# Patient Record
Sex: Male | Born: 1980 | Race: White | Hispanic: No | Smoking: Current every day smoker
Health system: Southern US, Community
[De-identification: ages and names within clinical notes are randomized; demographics above are authoritative.]

## PROBLEM LIST (undated history)

## (undated) ENCOUNTER — Emergency Department (HOSPITAL_BASED_OUTPATIENT_CLINIC_OR_DEPARTMENT_OTHER): Admission: EM | Payer: PRIVATE HEALTH INSURANCE | Source: Home / Self Care

## (undated) ENCOUNTER — Emergency Department (HOSPITAL_COMMUNITY): Admission: EM | Payer: PRIVATE HEALTH INSURANCE | Source: Home / Self Care

## (undated) DIAGNOSIS — F1910 Other psychoactive substance abuse, uncomplicated: Secondary | ICD-10-CM

## (undated) DIAGNOSIS — C9590 Leukemia, unspecified not having achieved remission: Secondary | ICD-10-CM

## (undated) HISTORY — PX: FACIAL FRACTURE SURGERY: SHX1570

## (undated) HISTORY — PX: FACIAL COSMETIC SURGERY: SHX629

---

## 2005-11-10 ENCOUNTER — Emergency Department (HOSPITAL_COMMUNITY): Admission: EM | Admit: 2005-11-10 | Discharge: 2005-11-10 | Payer: Self-pay | Admitting: Emergency Medicine

## 2006-02-02 ENCOUNTER — Emergency Department (HOSPITAL_COMMUNITY): Admission: EM | Admit: 2006-02-02 | Discharge: 2006-02-02 | Payer: Self-pay | Admitting: Emergency Medicine

## 2006-02-19 ENCOUNTER — Emergency Department (HOSPITAL_COMMUNITY): Admission: EM | Admit: 2006-02-19 | Discharge: 2006-02-19 | Payer: Self-pay | Admitting: Emergency Medicine

## 2006-08-06 ENCOUNTER — Emergency Department (HOSPITAL_COMMUNITY): Admission: EM | Admit: 2006-08-06 | Discharge: 2006-08-06 | Payer: Self-pay | Admitting: Emergency Medicine

## 2008-09-04 ENCOUNTER — Ambulatory Visit (HOSPITAL_COMMUNITY): Admission: EM | Admit: 2008-09-04 | Discharge: 2008-09-05 | Payer: Self-pay | Admitting: Emergency Medicine

## 2008-09-04 ENCOUNTER — Ambulatory Visit: Payer: Self-pay | Admitting: Diagnostic Radiology

## 2008-09-04 ENCOUNTER — Encounter: Payer: Self-pay | Admitting: Emergency Medicine

## 2009-01-14 ENCOUNTER — Emergency Department (HOSPITAL_BASED_OUTPATIENT_CLINIC_OR_DEPARTMENT_OTHER): Admission: EM | Admit: 2009-01-14 | Discharge: 2009-01-14 | Payer: Self-pay | Admitting: Emergency Medicine

## 2010-01-03 ENCOUNTER — Emergency Department (HOSPITAL_BASED_OUTPATIENT_CLINIC_OR_DEPARTMENT_OTHER): Admission: EM | Admit: 2010-01-03 | Discharge: 2010-01-03 | Payer: Self-pay | Admitting: Emergency Medicine

## 2010-06-10 ENCOUNTER — Emergency Department (HOSPITAL_COMMUNITY): Payer: PRIVATE HEALTH INSURANCE

## 2010-06-10 ENCOUNTER — Emergency Department (HOSPITAL_COMMUNITY)
Admission: EM | Admit: 2010-06-10 | Discharge: 2010-06-10 | Disposition: A | Discharge status: Home / Self Care | Payer: PRIVATE HEALTH INSURANCE | Attending: Emergency Medicine | Admitting: Emergency Medicine

## 2010-06-10 DIAGNOSIS — R0609 Other forms of dyspnea: Secondary | ICD-10-CM | POA: Insufficient documentation

## 2010-06-10 DIAGNOSIS — J029 Acute pharyngitis, unspecified: Secondary | ICD-10-CM | POA: Insufficient documentation

## 2010-06-10 DIAGNOSIS — R0989 Other specified symptoms and signs involving the circulatory and respiratory systems: Secondary | ICD-10-CM | POA: Insufficient documentation

## 2010-06-10 DIAGNOSIS — R059 Cough, unspecified: Secondary | ICD-10-CM | POA: Insufficient documentation

## 2010-06-10 DIAGNOSIS — R062 Wheezing: Secondary | ICD-10-CM | POA: Insufficient documentation

## 2010-06-10 DIAGNOSIS — R05 Cough: Secondary | ICD-10-CM | POA: Insufficient documentation

## 2010-07-14 LAB — CBC
Hemoglobin: 15.1 g/dL (ref 13.0–17.0)
MCH: 32.1 pg (ref 26.0–34.0)
MCHC: 34.5 g/dL (ref 30.0–36.0)
MCV: 93 fL (ref 78.0–100.0)
Platelets: 257 10*3/uL (ref 150–400)
RDW: 11.4 % — ABNORMAL LOW (ref 11.5–15.5)
WBC: 10.6 10*3/uL — ABNORMAL HIGH (ref 4.0–10.5)

## 2010-07-14 LAB — COMPREHENSIVE METABOLIC PANEL
BUN: 10 mg/dL (ref 6–23)
Chloride: 101 mEq/L (ref 96–112)
Creatinine, Ser: 0.8 mg/dL (ref 0.4–1.5)
GFR calc Af Amer: >60 mL/min (ref >60)
Glucose, Bld: 88 mg/dL (ref 70–99)
Sodium: 139 mEq/L (ref 135–145)
Total Bilirubin: 1.3 mg/dL — ABNORMAL HIGH (ref 0.3–1.2)

## 2010-07-14 LAB — DIFFERENTIAL
Eosinophils Relative: 1 % (ref 0–5)
Monocytes Absolute: 0.9 10*3/uL (ref 0.1–1.0)
Monocytes Relative: 8 % (ref 3–12)
Neutrophils Relative %: 71 % (ref 43–77)

## 2010-07-14 LAB — POCT CARDIAC MARKERS
CKMB, poc: 2.2 ng/mL (ref 1.0–8.0)
Troponin i, poc: <0.05 ng/mL (ref 0.00–0.09)

## 2010-08-09 LAB — CBC
HCT: 46.1 % (ref 39.0–52.0)
MCHC: 34.3 g/dL (ref 30.0–36.0)
MCV: 92.1 fL (ref 78.0–100.0)
Platelets: 238 10*3/uL (ref 150–400)
RBC: 5.01 MIL/uL (ref 4.22–5.81)
RDW: 11.5 % (ref 11.5–15.5)
WBC: 15.5 10*3/uL — ABNORMAL HIGH (ref 4.0–10.5)

## 2010-08-09 LAB — DIFFERENTIAL
Eosinophils Absolute: 0.1 10*3/uL (ref 0.0–0.7)
Eosinophils Relative: 1 % (ref 0–5)
Monocytes Relative: 4 % (ref 3–12)
Neutro Abs: 13.3 10*3/uL — ABNORMAL HIGH (ref 1.7–7.7)

## 2010-08-09 LAB — COMPREHENSIVE METABOLIC PANEL
ALT: 15 U/L (ref 0–53)
BUN: 13 mg/dL (ref 6–23)
Calcium: 9 mg/dL (ref 8.4–10.5)
Chloride: 106 mEq/L (ref 96–112)
Creatinine, Ser: 0.8 mg/dL (ref 0.4–1.5)
GFR calc Af Amer: >60 mL/min (ref >60)
Glucose, Bld: 91 mg/dL (ref 70–99)
Total Bilirubin: 0.8 mg/dL (ref 0.3–1.2)
Total Protein: 7.5 g/dL (ref 6.0–8.3)
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
## 2010-11-14 ENCOUNTER — Encounter: Payer: Self-pay | Admitting: *Deleted

## 2010-11-14 ENCOUNTER — Emergency Department (HOSPITAL_BASED_OUTPATIENT_CLINIC_OR_DEPARTMENT_OTHER)
Admission: EM | Admit: 2010-11-14 | Discharge: 2010-11-14 | Disposition: A | Discharge status: Home / Self Care | Payer: PRIVATE HEALTH INSURANCE | Attending: Emergency Medicine | Admitting: Emergency Medicine

## 2010-11-14 DIAGNOSIS — F17200 Nicotine dependence, unspecified, uncomplicated: Secondary | ICD-10-CM | POA: Insufficient documentation

## 2010-11-14 DIAGNOSIS — Z711 Person with feared health complaint in whom no diagnosis is made: Secondary | ICD-10-CM | POA: Insufficient documentation

## 2010-11-14 DIAGNOSIS — IMO0001 Reserved for inherently not codable concepts without codable children: Secondary | ICD-10-CM

## 2010-11-14 NOTE — ED Provider Notes (Signed)
History     Chief Complaint  Patient presents with  . SEXUALLY TRANSMITTED DISEASE   HPI Comments: Pt had unprotected oral sex 3 days ago.  Pt reports he was concerned that he could have caught a disease that his wife could get from him.   Pt has not had any symptoms.  No discharge.  No lesions.    Patient is a 30 y.o. male presenting with general illness. The history is provided by the patient.  Illness  The current episode started 2 days ago. Pertinent negatives include no fever, no abdominal pain and no swollen glands.    History reviewed. No pertinent past medical history.  History reviewed. No pertinent past surgical history.  History reviewed. No pertinent family history.  History  Substance Use Topics  . Smoking status: Current Everyday Smoker  . Smokeless tobacco: Not on file  . Alcohol Use: No      Review of Systems  Constitutional: Negative for fever.  Gastrointestinal: Negative for abdominal pain.  All other systems reviewed and are negative.    Physical Exam  BP 94/69  Pulse 64  Temp(Src) 98.5 F (36.9 C) (Oral)  Resp 20  Ht 5\' 11"  (1.803 m)  Wt 205 lb (92.987 kg)  BMI 28.59 kg/m2  SpO2 98%  Physical Exam  Constitutional: He appears well-developed and well-nourished.  HENT:  Head: Normocephalic.  Neurological: He is alert.    ED Course  Procedures  I advised pt to go to Health dept for evaluation.  I advised that we could test for gc and ct.  Pt declined      Langston Masker, [place] 11/14/10 1336  [place], [place] 11/14/10 [phone]
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
## 2010-11-15 ENCOUNTER — Emergency Department (HOSPITAL_COMMUNITY)
Admission: EM | Admit: 2010-11-15 | Discharge: 2010-11-15 | Disposition: A | Discharge status: Home / Self Care | Payer: PRIVATE HEALTH INSURANCE | Attending: Emergency Medicine | Admitting: Emergency Medicine

## 2010-11-15 DIAGNOSIS — Z202 Contact with and (suspected) exposure to infections with a predominantly sexual mode of transmission: Secondary | ICD-10-CM | POA: Insufficient documentation

## 2010-11-15 LAB — URINALYSIS, ROUTINE W REFLEX MICROSCOPIC
Glucose, UA: NEGATIVE mg/dL
Hgb urine dipstick: NEGATIVE
Protein, ur: NEGATIVE mg/dL
pH: 7 (ref 5.0–8.0)

## 2010-11-15 NOTE — ED Provider Notes (Signed)
Patient's care supervised by me.  I have read and reviewed note and agree with care.  Hilario Quarry, MD 11/15/10 [phone]

## 2010-11-16 LAB — GC/CHLAMYDIA PROBE AMP, GENITAL: Chlamydia, DNA Probe: NEGATIVE

## 2011-07-04 ENCOUNTER — Encounter (HOSPITAL_BASED_OUTPATIENT_CLINIC_OR_DEPARTMENT_OTHER): Payer: Self-pay | Admitting: *Deleted

## 2011-07-04 ENCOUNTER — Emergency Department (HOSPITAL_BASED_OUTPATIENT_CLINIC_OR_DEPARTMENT_OTHER)
Admission: EM | Admit: 2011-07-04 | Discharge: 2011-07-04 | Disposition: A | Discharge status: Home / Self Care | Payer: PRIVATE HEALTH INSURANCE | Attending: Emergency Medicine | Admitting: Emergency Medicine

## 2011-07-04 DIAGNOSIS — J329 Chronic sinusitis, unspecified: Secondary | ICD-10-CM

## 2011-07-04 DIAGNOSIS — F17200 Nicotine dependence, unspecified, uncomplicated: Secondary | ICD-10-CM | POA: Insufficient documentation

## 2011-07-04 MED ORDER — AMOXICILLIN 500 MG PO CAPS: 500.0000 mg | ORAL_CAPSULE | Freq: Three times a day (TID) | ORAL | Status: AC

## 2011-07-04 NOTE — ED Notes (Signed)
States he has had sinus problems with nasal drainage, sore throat and headache on and off for the past month.

## 2011-07-04 NOTE — Discharge Instructions (Signed)
Sinusitis Sinusitis an infection of the air pockets (sinuses) in your face. This can cause puffiness (swelling). It can also cause drainage from your sinuses.   HOME CARE    Only take medicine as told by your doctor.   Drink enough fluids to keep your pee (urine) clear or pale yellow.   Apply moist heat or ice packs for pain relief.   Use salt (saline) nose sprays. The spray will wet the thick fluid in the nose. This can help the sinuses drain.  GET HELP RIGHT AWAY IF:    You have a fever.   Your baby is older than 3 months with a rectal temperature of 102 F (38.9 C) or higher.   Your baby is 3 months old or younger with a rectal temperature of 100.4 F (38 C) or higher.   The pain gets worse.   You get a very bad headache.   You keep throwing up (vomiting).   Your face gets puffy.  MAKE SURE YOU:    Understand these instructions.   Will watch your condition.   Will get help right away if you are not doing well or get worse.  Document Released: 10/04/2007 Document Revised: 04/06/2011 Document Reviewed: 10/04/2007 Exit[employer] Patient Information 2012 [employer]. 

## 2011-07-04 NOTE — ED Provider Notes (Signed)
History     CSN: [id number]  Arrival date & time 07/04/11  1949   First MD Initiated Contact with Patient 07/04/11 2000      Chief Complaint  Patient presents with  . Nasal Congestion    (Consider location/radiation/quality/duration/timing/severity/associated sxs/prior treatment) Patient is a 31 y.o. male presenting with sinusitis. The history is provided by the patient. No language interpreter was used.  Sinusitis  This is a new problem. The current episode started more than 1 week ago. The problem has not changed since onset.There has been no fever. The pain is moderate. The pain has been constant since onset. Associated symptoms include congestion and sinus pressure. Pertinent negatives include no cough and no shortness of breath. He has tried acetaminophen and other medications for the symptoms. The treatment provided no relief.    History reviewed. No pertinent past medical history.  History reviewed. No pertinent past surgical history.  History reviewed. No pertinent family history.  History  Substance Use Topics  . Smoking status: Current Everyday Smoker  . Smokeless tobacco: Not on file  . Alcohol Use: No      Review of Systems  HENT: Positive for congestion and sinus pressure.   Respiratory: Negative for cough and shortness of breath.   All other systems reviewed and are negative.    Allergies  Review of patient's allergies indicates no known allergies.  Home Medications   Current Outpatient Rx  Name Route Sig Dispense Refill  . ACETAMINOPHEN 500 MG PO TABS Oral Take 1,000 mg by mouth once as needed. For pain    . TYLENOL SINUS CONGESTION/PAIN PO Oral Take 2 tablets by mouth once as needed. For sinus pain    . PSEUDOEPHEDRINE-IBUPROFEN 30-200 MG PO TABS Oral Take 2 tablets by mouth once as needed. For sinus pain    . AMOXICILLIN 500 MG PO CAPS Oral Take 1 capsule (500 mg total) by mouth 3 (three) times daily. 30 capsule 0    BP 126/99  Pulse 76   Temp(Src) 97.1 F (36.2 C) (Oral)  Resp 18  Ht 5\' 11"  (1.803 m)  Wt 190 lb (86.183 kg)  BMI 26.50 kg/m2  SpO2 100%  Physical Exam  Nursing note and vitals reviewed. Constitutional: He appears well-developed and well-nourished.  HENT:  Head: Normocephalic and atraumatic.  Right Ear: External ear normal.  Left Ear: External ear normal.  Nose: Mucosal edema present. Right sinus exhibits frontal sinus tenderness. Left sinus exhibits frontal sinus tenderness.  Cardiovascular: Normal rate and regular rhythm.   Pulmonary/Chest: Effort normal and breath sounds normal.    ED Course  Procedures (including critical care time)  Labs Reviewed - No data to display No results found.   1. Sinusitis       MDM  Will treat for sinusitis         Teressa Lower, NP 07/04/11 2028

## 2011-07-04 NOTE — ED Notes (Signed)
Pt c/o simus pressure x 1 month

## 2011-07-05 NOTE — ED Provider Notes (Signed)
Medical screening examination/treatment/procedure(s) were performed by non-physician practitioner and as supervising physician I was immediately available for consultation/collaboration.   Brennan Karam, MD 07/05/11 0823 

## 2012-04-18 ENCOUNTER — Emergency Department (HOSPITAL_BASED_OUTPATIENT_CLINIC_OR_DEPARTMENT_OTHER)
Admission: EM | Admit: 2012-04-18 | Discharge: 2012-04-18 | Disposition: A | Discharge status: Home / Self Care | Payer: PRIVATE HEALTH INSURANCE | Attending: Emergency Medicine | Admitting: Emergency Medicine

## 2012-04-18 ENCOUNTER — Encounter (HOSPITAL_BASED_OUTPATIENT_CLINIC_OR_DEPARTMENT_OTHER): Payer: Self-pay | Admitting: Family Medicine

## 2012-04-18 DIAGNOSIS — S0993XA Unspecified injury of face, initial encounter: Secondary | ICD-10-CM | POA: Insufficient documentation

## 2012-04-18 DIAGNOSIS — Z791 Long term (current) use of non-steroidal anti-inflammatories (NSAID): Secondary | ICD-10-CM | POA: Insufficient documentation

## 2012-04-18 DIAGNOSIS — S4980XA Other specified injuries of shoulder and upper arm, unspecified arm, initial encounter: Secondary | ICD-10-CM | POA: Insufficient documentation

## 2012-04-18 DIAGNOSIS — Y9389 Activity, other specified: Secondary | ICD-10-CM | POA: Insufficient documentation

## 2012-04-18 DIAGNOSIS — S46909A Unspecified injury of unspecified muscle, fascia and tendon at shoulder and upper arm level, unspecified arm, initial encounter: Secondary | ICD-10-CM | POA: Insufficient documentation

## 2012-04-18 DIAGNOSIS — Z79899 Other long term (current) drug therapy: Secondary | ICD-10-CM | POA: Insufficient documentation

## 2012-04-18 DIAGNOSIS — F17200 Nicotine dependence, unspecified, uncomplicated: Secondary | ICD-10-CM | POA: Insufficient documentation

## 2012-04-18 DIAGNOSIS — IMO0001 Reserved for inherently not codable concepts without codable children: Secondary | ICD-10-CM | POA: Insufficient documentation

## 2012-04-18 DIAGNOSIS — S4990XA Unspecified injury of shoulder and upper arm, unspecified arm, initial encounter: Secondary | ICD-10-CM

## 2012-04-18 DIAGNOSIS — S199XXA Unspecified injury of neck, initial encounter: Secondary | ICD-10-CM | POA: Insufficient documentation

## 2012-04-18 DIAGNOSIS — Y929 Unspecified place or not applicable: Secondary | ICD-10-CM | POA: Insufficient documentation

## 2012-04-18 DIAGNOSIS — R296 Repeated falls: Secondary | ICD-10-CM | POA: Insufficient documentation

## 2012-04-18 DIAGNOSIS — IMO0002 Reserved for concepts with insufficient information to code with codable children: Secondary | ICD-10-CM | POA: Insufficient documentation

## 2012-04-18 MED ORDER — HYDROCODONE-ACETAMINOPHEN 5-325 MG PO TABS: 1.0000 | ORAL_TABLET | Freq: Four times a day (QID) | ORAL | Status: DC | PRN

## 2012-04-18 MED ORDER — METHOCARBAMOL 500 MG PO TABS: 500.0000 mg | ORAL_TABLET | Freq: Two times a day (BID) | ORAL | Status: DC

## 2012-04-18 MED ORDER — IBUPROFEN 600 MG PO TABS: 600.0000 mg | ORAL_TABLET | Freq: Four times a day (QID) | ORAL | Status: DC | PRN

## 2012-04-18 NOTE — ED Provider Notes (Signed)
History     CSN: [id number]  Arrival date & time 04/18/12  1038   First MD Initiated Contact with Patient 04/18/12 1200      Chief Complaint  Patient presents with  . Back Pain    (Consider location/radiation/quality/duration/timing/severity/associated sxs/prior treatment) HPI Comments: PT comes in with cc of back injury. About 2 days ago, patient was putting up Christmas lights on the roof, slid, and to prevent a fall from the roof hung on to a ledge. Pt has been having some back pain since then - located on the left subscapular side, neck and shoulder. Although he has constant mild pain, the pain is really worse with any movement for the shoulder or lifting.  The history is provided by the patient.    History reviewed. No pertinent past medical history.  History reviewed. No pertinent past surgical history.  No family history on file.  History  Substance Use Topics  . Smoking status: Current Every Day Smoker  . Smokeless tobacco: Not on file  . Alcohol Use: No      Review of Systems  Constitutional: Positive for activity change.  Respiratory: Negative for chest tightness.   Cardiovascular: Negative for chest pain.  Gastrointestinal: Negative for abdominal pain.  Musculoskeletal: Positive for myalgias.    Allergies  Review of patient's allergies indicates no known allergies.  Home Medications   Current Outpatient Rx  Name  Route  Sig  Dispense  Refill  . ACETAMINOPHEN 500 MG PO TABS   Oral   Take 1,000 mg by mouth once as needed. For pain         . HYDROCODONE-ACETAMINOPHEN 5-325 MG PO TABS   Oral   Take 1 tablet by mouth every 6 (six) hours as needed for pain.   15 tablet   0   . IBUPROFEN 600 MG PO TABS   Oral   Take 1 tablet (600 mg total) by mouth every 6 (six) hours as needed for pain.   30 tablet   0   . METHOCARBAMOL 500 MG PO TABS   Oral   Take 1 tablet (500 mg total) by mouth 2 (two) times daily.   20 tablet   0   . TYLENOL SINUS  CONGESTION/PAIN PO   Oral   Take 2 tablets by mouth once as needed. For sinus pain         . PSEUDOEPHEDRINE-IBUPROFEN 30-200 MG PO TABS   Oral   Take 2 tablets by mouth once as needed. For sinus pain           BP 114/72  Pulse 80  Temp 97.6 F (36.4 C) (Oral)  Resp 16  Ht 5\' 11"  (1.803 m)  Wt 190 lb (86.183 kg)  BMI 26.50 kg/m2  SpO2 100%  Physical Exam  Nursing note and vitals reviewed. Constitutional: He is oriented to person, place, and time. He appears well-developed.  HENT:  Head: Normocephalic and atraumatic.  Eyes: Conjunctivae normal and EOM are normal. Pupils are equal, round, and reactive to light.  Neck: Normal range of motion. Neck supple.  Cardiovascular: Normal rate and regular rhythm.   Pulmonary/Chest: Effort normal and breath sounds normal.  Abdominal: Soft. Bowel sounds are normal. He exhibits no distension. There is no tenderness. There is no rebound and no guarding.  Musculoskeletal: He exhibits no edema and no tenderness.       No deformity, no muscular atrophy or lumping seen. Pt able to abduct, adduct, forward flex. He has tenderness with  all movement. No winged scapula. With palpation, the tenderness present over the subscapular region.  Neurological: He is alert and oriented to person, place, and time.  Skin: Skin is warm.    ED Course  Procedures (including critical care time)  Labs Reviewed - No data to display No results found.   1. Injury of scapular region   2. Sprain and strain of back       MDM  Pt comes in with some muscular injury to the back. While falling, he held on to a ledge and ended up hanging on the ledge for a couple of minutes to prevent a fall. Though no evidence of muscular tear is appreciated - it is possible that he might have some partial tear.  Plan is for him to use RICE tx, analgesics, and if not better within a period of few days - see orthopedic doctor as he will need PT and possible US/MRI.  Derwood Kaplan, MD 04/18/12 1310

## 2012-04-18 NOTE — ED Notes (Addendum)
Pt c/o left lateral neck pain, left shoulder and left upper back pain after hanging christmas lights on house Tuesday. Pt sts "I think I pulled a muscle" pt sts he is having a hard time performing his job responsibilities due to pain. Pt drove self to ED.

## 2013-04-18 ENCOUNTER — Emergency Department (HOSPITAL_BASED_OUTPATIENT_CLINIC_OR_DEPARTMENT_OTHER)
Admission: EM | Admit: 2013-04-18 | Discharge: 2013-04-18 | Disposition: A | Discharge status: Home / Self Care | Payer: PRIVATE HEALTH INSURANCE | Attending: Emergency Medicine | Admitting: Emergency Medicine

## 2013-04-18 ENCOUNTER — Encounter (HOSPITAL_BASED_OUTPATIENT_CLINIC_OR_DEPARTMENT_OTHER): Payer: Self-pay | Admitting: Emergency Medicine

## 2013-04-18 ENCOUNTER — Emergency Department (HOSPITAL_BASED_OUTPATIENT_CLINIC_OR_DEPARTMENT_OTHER): Payer: PRIVATE HEALTH INSURANCE

## 2013-04-18 DIAGNOSIS — K0381 Cracked tooth: Secondary | ICD-10-CM | POA: Insufficient documentation

## 2013-04-18 DIAGNOSIS — J069 Acute upper respiratory infection, unspecified: Secondary | ICD-10-CM | POA: Insufficient documentation

## 2013-04-18 DIAGNOSIS — K089 Disorder of teeth and supporting structures, unspecified: Secondary | ICD-10-CM | POA: Insufficient documentation

## 2013-04-18 DIAGNOSIS — Z79899 Other long term (current) drug therapy: Secondary | ICD-10-CM | POA: Insufficient documentation

## 2013-04-18 DIAGNOSIS — F17200 Nicotine dependence, unspecified, uncomplicated: Secondary | ICD-10-CM | POA: Insufficient documentation

## 2013-04-18 DIAGNOSIS — K0889 Other specified disorders of teeth and supporting structures: Secondary | ICD-10-CM

## 2013-04-18 MED ORDER — AMOXICILLIN 500 MG PO CAPS: 500.0000 mg | ORAL_CAPSULE | Freq: Three times a day (TID) | ORAL | Status: DC

## 2013-04-18 MED ORDER — HYDROCODONE-ACETAMINOPHEN 5-325 MG PO TABS: 2.0000 | ORAL_TABLET | ORAL | Status: DC | PRN

## 2013-04-18 NOTE — ED Provider Notes (Signed)
Medical screening examination/treatment/procedure(s) were performed by non-physician practitioner and as supervising physician I was immediately available for consultation/collaboration.  EKG Interpretation   None        Doug Sou, MD 04/18/13 [phone]

## 2013-04-18 NOTE — ED Notes (Signed)
Dental pain. States he has also has a drug problem and would like a referral to detox.

## 2013-04-18 NOTE — ED Provider Notes (Signed)
CSN: [id number]     Arrival date & time 04/18/13  1818 History   First MD Initiated Contact with Patient 04/18/13 1840     Chief Complaint  Patient presents with  . Dental Pain   (Consider location/radiation/quality/duration/timing/severity/associated sxs/prior Treatment) Patient is a 32 y.o. male presenting with tooth pain. The history is provided by the patient.  Dental Pain Quality:  Shooting Severity:  Moderate Onset quality:  Gradual Duration:  3 days Timing:  Constant Progression:  Worsening Chronicity:  New Relieved by:  Nothing Worsened by:  Nothing tried Ineffective treatments:  None tried Associated symptoms: congestion     History reviewed. No pertinent past medical history. History reviewed. No pertinent past surgical history. No family history on file. History  Substance Use Topics  . Smoking status: Current Every Day Smoker  . Smokeless tobacco: Not on file  . Alcohol Use: No    Review of Systems  HENT: Positive for congestion.   All other systems reviewed and are negative.    Allergies  Review of patient's allergies indicates no known allergies.  Home Medications   Current Outpatient Rx  Name  Route  Sig  Dispense  Refill  . acetaminophen (TYLENOL) 500 MG tablet   Oral   Take 1,000 mg by mouth once as needed. For pain         . HYDROcodone-acetaminophen (NORCO/VICODIN) 5-325 MG per tablet   Oral   Take 1 tablet by mouth every 6 (six) hours as needed for pain.   15 tablet   0   . ibuprofen (ADVIL,MOTRIN) 600 MG tablet   Oral   Take 1 tablet (600 mg total) by mouth every 6 (six) hours as needed for pain.   30 tablet   0   . methocarbamol (ROBAXIN) 500 MG tablet   Oral   Take 1 tablet (500 mg total) by mouth 2 (two) times daily.   20 tablet   0   . Phenylephrine-Acetaminophen (TYLENOL SINUS CONGESTION/PAIN PO)   Oral   Take 2 tablets by mouth once as needed. For sinus pain         . Pseudoephedrine-Ibuprofen 30-200 MG TABS  Oral   Take 2 tablets by mouth once as needed. For sinus pain          BP 128/77  Pulse 110  Temp(Src) 98.5 F (36.9 C) (Oral)  Resp 20  Ht 5\' 11"  (1.803 m)  Wt 172 lb (78.019 kg)  BMI 24.00 kg/m2  SpO2 100% Physical Exam  Nursing note and vitals reviewed. Constitutional: He is oriented to person, place, and time. He appears well-developed and well-nourished.  HENT:  Head: Normocephalic.  Multiple broken teeth  Eyes: EOM are normal. Pupils are equal, round, and reactive to light.  Neck: Normal range of motion.  Cardiovascular: Normal rate.   Pulmonary/Chest: Effort normal.  Abdominal: He exhibits no distension.  Musculoskeletal: Normal range of motion.  Neurological: He is alert and oriented to person, place, and time.  Psychiatric: He has a normal mood and affect.    ED Course  Procedures (including critical care time) Labs Review Labs Reviewed - No data to display Imaging Review Dg Chest 2 View  04/18/2013   CLINICAL DATA:  Left arm pain.  EXAM: CHEST  2 VIEW  COMPARISON:  06/10/2010  FINDINGS: The heart size and mediastinal contours are within normal limits. Both lungs are clear. The visualized skeletal structures are unremarkable.  IMPRESSION: No active cardiopulmonary disease.   Electronically Signed  By: Charlett Nose M.D.   On: 04/18/2013 19:17    EKG Interpretation   None       MDM   1. Toothache   2. URI (upper respiratory infection)      Behavioral health number given  Pt also given number for treatment centers   Elson Areas, [place] 04/18/13 1946

## 2013-04-27 ENCOUNTER — Encounter (HOSPITAL_COMMUNITY): Payer: Self-pay | Admitting: Emergency Medicine

## 2013-04-27 ENCOUNTER — Emergency Department (HOSPITAL_COMMUNITY)
Admission: EM | Admit: 2013-04-27 | Discharge: 2013-04-27 | Disposition: A | Discharge status: Home / Self Care | Payer: PRIVATE HEALTH INSURANCE | Attending: Emergency Medicine | Admitting: Emergency Medicine

## 2013-04-27 DIAGNOSIS — K0889 Other specified disorders of teeth and supporting structures: Secondary | ICD-10-CM

## 2013-04-27 DIAGNOSIS — K089 Disorder of teeth and supporting structures, unspecified: Secondary | ICD-10-CM | POA: Insufficient documentation

## 2013-04-27 DIAGNOSIS — J9801 Acute bronchospasm: Secondary | ICD-10-CM

## 2013-04-27 DIAGNOSIS — K029 Dental caries, unspecified: Secondary | ICD-10-CM | POA: Insufficient documentation

## 2013-04-27 DIAGNOSIS — R059 Cough, unspecified: Secondary | ICD-10-CM | POA: Insufficient documentation

## 2013-04-27 DIAGNOSIS — F1910 Other psychoactive substance abuse, uncomplicated: Secondary | ICD-10-CM | POA: Insufficient documentation

## 2013-04-27 DIAGNOSIS — R062 Wheezing: Secondary | ICD-10-CM | POA: Insufficient documentation

## 2013-04-27 DIAGNOSIS — Z79899 Other long term (current) drug therapy: Secondary | ICD-10-CM | POA: Insufficient documentation

## 2013-04-27 DIAGNOSIS — F17200 Nicotine dependence, unspecified, uncomplicated: Secondary | ICD-10-CM | POA: Insufficient documentation

## 2013-04-27 DIAGNOSIS — R05 Cough: Secondary | ICD-10-CM | POA: Insufficient documentation

## 2013-04-27 MED ORDER — PREDNISONE 20 MG PO TABS: ORAL_TABLET | ORAL | Status: DC

## 2013-04-27 MED ORDER — CLINDAMYCIN HCL 300 MG PO CAPS: 300.0000 mg | ORAL_CAPSULE | Freq: Four times a day (QID) | ORAL | Status: DC

## 2013-04-27 MED ORDER — ALBUTEROL SULFATE HFA 108 (90 BASE) MCG/ACT IN AERS
2.0000 | INHALATION_SPRAY | Freq: Once | RESPIRATORY_TRACT | Status: AC
  Administered 2013-04-27: 2 via RESPIRATORY_TRACT
  Filled 2013-04-27: qty 6.7

## 2013-04-27 MED ORDER — PREDNISONE 20 MG PO TABS
60.0000 mg | ORAL_TABLET | Freq: Once | ORAL | Status: AC
  Administered 2013-04-27: 60 mg via ORAL
  Filled 2013-04-27: qty 3

## 2013-04-27 MED ORDER — ALBUTEROL SULFATE HFA 108 (90 BASE) MCG/ACT IN AERS: 2.0000 | INHALATION_SPRAY | RESPIRATORY_TRACT | Status: DC | PRN

## 2013-04-27 NOTE — ED Notes (Signed)
Per pt states broken lower molar, having pain

## 2013-04-27 NOTE — ED Provider Notes (Signed)
CSN: [id number]     Arrival date & time 04/27/13  1719 History   First MD Initiated Contact with Patient 04/27/13 2050     Chief Complaint  Patient presents with  . Dental Pain   (Consider location/radiation/quality/duration/timing/severity/associated sxs/prior Treatment) HPI Chronic dental pain teeth numbers 18 and 30 worse the last few days, history of substance abuse states has been clean for several months but still wants to look into referrals for outpatient rehabilitation programs, not suicidal or homicidal or hallucinating, has been coughing with some mild wheezing without shortness of breath for several days no history of asthma no fever no confusion no chest pain no abdominal pain no vomiting no diarrhea no rash no treatment prior to arrival other than amoxicillin within the last couple weeks for dental pain with no improvement. History reviewed. No pertinent past medical history. History reviewed. No pertinent past surgical history. No family history on file. History  Substance Use Topics  . Smoking status: Current Every Day Smoker  . Smokeless tobacco: Not on file  . Alcohol Use: No    Review of Systems 10 Systems reviewed and are negative for acute change except as noted in the HPI. Allergies  Review of patient's allergies indicates no known allergies.  Home Medications   Current Outpatient Rx  Name  Route  Sig  Dispense  Refill  . acetaminophen (TYLENOL) 500 MG tablet   Oral   Take 1,000 mg by mouth once as needed. For pain         . benzocaine (ORAJEL) 10 % mucosal gel   Mouth/Throat   Use as directed 1 application in the mouth or throat as needed for mouth pain.         Marland Kitchen albuterol (PROVENTIL HFA;VENTOLIN HFA) 108 (90 BASE) MCG/ACT inhaler   Inhalation   Inhale 2 puffs into the lungs every 2 (two) hours as needed for wheezing or shortness of breath (cough).   1 Inhaler   0   . clindamycin (CLEOCIN) 300 MG capsule   Oral   Take 1 capsule (300 mg total)  by mouth 4 (four) times daily. X 7 days   28 capsule   0   . predniSONE (DELTASONE) 20 MG tablet      3 tabs po day one, then 2 po daily x 4 days   11 tablet   0    BP 128/75  Pulse 79  Temp(Src) 97.6 F (36.4 C) (Oral)  Resp 18  SpO2 99% Physical Exam  Nursing note and vitals reviewed. Constitutional:  Awake, alert, nontoxic appearance.  HENT:  Head: Atraumatic.  Mouth/Throat: Oropharynx is clear and moist.  Dental caries teeth numbers 18 and 30 with localized percussion tenderness with localized gingival tenderness without gingival swelling or purulent drainage no trismus no drooling no submandibular swelling  Eyes: Right eye exhibits no discharge. Left eye exhibits no discharge.  Neck: Neck supple.  Cardiovascular: Normal rate and regular rhythm.   No murmur heard. Pulmonary/Chest: Effort normal. No respiratory distress. He has wheezes. He has no rales. He exhibits no tenderness.  Scattered expiratory wheezes with pulse oximetry normal room air 97% no retractions no such or muscle usage speech is normal  Abdominal: Soft. There is no tenderness. There is no rebound.  Musculoskeletal: He exhibits no tenderness.  Baseline ROM, no obvious new focal weakness.  Lymphadenopathy:    He has no cervical adenopathy.  Neurological:  Mental status and motor strength appears baseline for patient and situation.  Skin: No  rash noted.  Psychiatric: He has a normal mood and affect.    ED Course  Procedures (including critical care time) Patient informed of clinical course, understand medical decision-making process, and agree with plan. Labs Review Labs Reviewed - No data to display Imaging Review No results found.  EKG Interpretation   None       MDM   1. Pain, dental   2. Bronchospasm   3. Substance abuse    I doubt any other EMC precluding discharge at this time including, but not necessarily limited to the following:deep space neck infection.    Hurman Horn,  MD 04/28/13 2020

## 2013-07-11 ENCOUNTER — Emergency Department (HOSPITAL_BASED_OUTPATIENT_CLINIC_OR_DEPARTMENT_OTHER)
Admission: EM | Admit: 2013-07-11 | Discharge: 2013-07-11 | Disposition: A | Discharge status: Home / Self Care | Payer: PRIVATE HEALTH INSURANCE | Attending: Emergency Medicine | Admitting: Emergency Medicine

## 2013-07-11 ENCOUNTER — Encounter (HOSPITAL_BASED_OUTPATIENT_CLINIC_OR_DEPARTMENT_OTHER): Payer: Self-pay | Admitting: Emergency Medicine

## 2013-07-11 DIAGNOSIS — J329 Chronic sinusitis, unspecified: Secondary | ICD-10-CM | POA: Insufficient documentation

## 2013-07-11 DIAGNOSIS — Z792 Long term (current) use of antibiotics: Secondary | ICD-10-CM | POA: Insufficient documentation

## 2013-07-11 DIAGNOSIS — R040 Epistaxis: Secondary | ICD-10-CM | POA: Insufficient documentation

## 2013-07-11 DIAGNOSIS — IMO0002 Reserved for concepts with insufficient information to code with codable children: Secondary | ICD-10-CM | POA: Insufficient documentation

## 2013-07-11 DIAGNOSIS — F17200 Nicotine dependence, unspecified, uncomplicated: Secondary | ICD-10-CM | POA: Insufficient documentation

## 2013-07-11 HISTORY — DX: Other psychoactive substance abuse, uncomplicated: F19.10

## 2013-07-11 MED ORDER — AMOXICILLIN-POT CLAVULANATE 500-125 MG PO TABS: 1.0000 | ORAL_TABLET | Freq: Three times a day (TID) | ORAL | Status: DC

## 2013-07-11 MED ORDER — PREDNISONE 10 MG PO TABS: 20.0000 mg | ORAL_TABLET | Freq: Every day | ORAL | Status: DC

## 2013-07-11 NOTE — ED Provider Notes (Signed)
CSN: [id number]     Arrival date & time 07/11/13  1029 History   First MD Initiated Contact with Patient 07/11/13 1051     Chief Complaint  Patient presents with  . Facial Pain  . URI     (Consider location/radiation/quality/duration/timing/severity/associated sxs/prior Treatment) HPI 32y.o male with nasal congestion, sinus pressure/pain, and discolored discharge for three days.  Subjective fever and history of sinusitis in past.  He has recently been on amoxicillin for dental problems.   Past Medical History  Diagnosis Date  . Substance abuse    Past Surgical History  Procedure Laterality Date  . Facial cosmetic surgery     No family history on file. History  Substance Use Topics  . Smoking status: Current Every Day Smoker -- 0.50 packs/day    Types: Cigarettes  . Smokeless tobacco: Not on file  . Alcohol Use: Yes     Comment: occasional    Review of Systems  All other systems reviewed and are negative.      Allergies  Review of patient's allergies indicates no known allergies.  Home Medications   Current Outpatient Rx  Name  Route  Sig  Dispense  Refill  . acetaminophen (TYLENOL) 500 MG tablet   Oral   Take 1,000 mg by mouth once as needed. For pain         . albuterol (PROVENTIL HFA;VENTOLIN HFA) 108 (90 BASE) MCG/ACT inhaler   Inhalation   Inhale 2 puffs into the lungs every 2 (two) hours as needed for wheezing or shortness of breath (cough).   1 Inhaler   0   . amoxicillin-clavulanate (AUGMENTIN) 500-125 MG per tablet   Oral   Take 1 tablet (500 mg total) by mouth every 8 (eight) hours.   21 tablet   0   . benzocaine (ORAJEL) 10 % mucosal gel   Mouth/Throat   Use as directed 1 application in the mouth or throat as needed for mouth pain.         . clindamycin (CLEOCIN) 300 MG capsule   Oral   Take 1 capsule (300 mg total) by mouth 4 (four) times daily. X 7 days   28 capsule   0   . predniSONE (DELTASONE) 10 MG tablet   Oral   Take 2  tablets (20 mg total) by mouth daily.   15 tablet   0   . predniSONE (DELTASONE) 20 MG tablet      3 tabs po day one, then 2 po daily x 4 days   11 tablet   0    BP 127/84  Pulse 100  Temp(Src) 97.9 F (36.6 C) (Oral)  Resp 16  Ht 5\' 11"  (1.803 m)  Wt 180 lb (81.647 kg)  BMI 25.12 kg/m2  SpO2 100% Physical Exam  Nursing note and vitals reviewed. Constitutional: He is oriented to person, place, and time. He appears well-developed and well-nourished.  HENT:  Head: Normocephalic and atraumatic.    Right Ear: External ear normal.  Left Ear: External ear normal.  Mouth/Throat: Oropharynx is clear and moist.  Eyes: Conjunctivae and EOM are normal. Pupils are equal, round, and reactive to light.  Neck: Normal range of motion. Neck supple.  Cardiovascular: Normal rate, regular rhythm and normal heart sounds.   Pulmonary/Chest: Effort normal and breath sounds normal.  Abdominal: Soft. Bowel sounds are normal.  Musculoskeletal: Normal range of motion.  Neurological: He is alert and oriented to person, place, and time. He has normal reflexes.  Skin:  Skin is warm and dry.  Psychiatric: He has a normal mood and affect. His behavior is normal. Thought content normal.    ED Course  Procedures (including critical care time) Labs Review Labs Reviewed - No data to display Imaging Review No results found.   EKG Interpretation None      MDM   Final diagnoses:  Sinusitis  Nosebleed       Shaune Pollack, MD 07/11/13 [phone]

## 2013-07-11 NOTE — Discharge Instructions (Signed)

## 2013-07-11 NOTE — ED Notes (Signed)
Cold symptoms x 1 week and sinus pressure since Monday.

## 2014-05-02 ENCOUNTER — Encounter (HOSPITAL_COMMUNITY): Payer: Self-pay | Admitting: Emergency Medicine

## 2014-05-02 ENCOUNTER — Emergency Department (HOSPITAL_COMMUNITY)
Admission: EM | Admit: 2014-05-02 | Discharge: 2014-05-03 | Disposition: A | Discharge status: Home / Self Care | Payer: PRIVATE HEALTH INSURANCE | Attending: Emergency Medicine | Admitting: Emergency Medicine

## 2014-05-02 DIAGNOSIS — Z7952 Long term (current) use of systemic steroids: Secondary | ICD-10-CM | POA: Insufficient documentation

## 2014-05-02 DIAGNOSIS — F1210 Cannabis abuse, uncomplicated: Secondary | ICD-10-CM | POA: Insufficient documentation

## 2014-05-02 DIAGNOSIS — Z79899 Other long term (current) drug therapy: Secondary | ICD-10-CM | POA: Insufficient documentation

## 2014-05-02 DIAGNOSIS — Z720 Tobacco use: Secondary | ICD-10-CM | POA: Insufficient documentation

## 2014-05-02 DIAGNOSIS — K6289 Other specified diseases of anus and rectum: Secondary | ICD-10-CM

## 2014-05-02 DIAGNOSIS — F1910 Other psychoactive substance abuse, uncomplicated: Secondary | ICD-10-CM

## 2014-05-02 DIAGNOSIS — F1410 Cocaine abuse, uncomplicated: Secondary | ICD-10-CM | POA: Insufficient documentation

## 2014-05-02 LAB — RAPID URINE DRUG SCREEN, HOSP PERFORMED
Amphetamines: NOT DETECTED
Barbiturates: NOT DETECTED
Benzodiazepines: NOT DETECTED
Cocaine: POSITIVE — AB
OPIATES: NOT DETECTED
TETRAHYDROCANNABINOL: POSITIVE — AB

## 2014-05-02 LAB — ETHANOL: Alcohol, Ethyl (B): <5 mg/dL (ref 0–9)
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
## 2014-05-03 NOTE — Discharge Instructions (Signed)
Polysubstance Abuse °When people abuse more than one drug or type of drug it is called polysubstance or polydrug abuse. For example, many smokers also drink alcohol. This is one form of polydrug abuse. Polydrug abuse also refers to the use of a drug to counteract an unpleasant effect produced by another drug. It may also be used to help with withdrawal from another drug. People who take stimulants may become agitated. Sometimes this agitation is countered with a tranquilizer. This helps protect against the unpleasant side effects. Polydrug abuse also refers to the use of different drugs at the same time.  °Anytime drug use is interfering with normal living activities, it has become abuse. This includes problems with family and friends. Psychological dependence has developed when your mind tells you that the drug is needed. This is usually followed by physical dependence which has developed when continuing increases of drug are required to get the same feeling or "high". This is known as addiction or chemical dependency. A person's risk is much higher if there is a history of chemical dependency in the family. °SIGNS OF CHEMICAL DEPENDENCY °· You have been told by friends or family that drugs have become a problem. °· You fight when using drugs. °· You are having blackouts (not remembering what you do while using). °· You feel sick from using drugs but continue using. °· You lie about use or amounts of drugs (chemicals) used. °· You need chemicals to get you going. °· You are suffering in work performance or in school because of drug use. °· You get sick from use of drugs but continue to use anyway. °· You need drugs to relate to people or feel comfortable in social situations. °· You use drugs to forget problems. °"Yes" answered to any of the above signs of chemical dependency indicates there are problems. The longer the use of drugs continues, the greater the problems will become. °If there is a family history of  drug or alcohol use, it is best not to experiment with these drugs. Continual use leads to tolerance. After tolerance develops more of the drug is needed to get the same feeling. This is followed by addiction. With addiction, drugs become the most important part of life. It becomes more important to take drugs than participate in the other usual activities of life. This includes relating to friends and family. Addiction is followed by dependency. Dependency is a condition where drugs are now needed not just to get high, but to feel normal. °Addiction cannot be cured but it can be stopped. This often requires outside help and the care of professionals. Treatment centers are listed in the yellow pages under: Cocaine, Narcotics, and Alcoholics Anonymous. Most hospitals and clinics can refer you to a specialized care center. Talk to your caregiver if you need help. °Document Released: 12/07/2004 Document Revised: 07/10/2011 Document Reviewed: 04/17/2005 °ExitCare® Patient Information ©2015 [employer]. This information is not intended to replace advice given to you by your health care provider. Make sure you discuss any questions you have with your health care provider. ° °

## 2014-09-03 ENCOUNTER — Encounter (HOSPITAL_BASED_OUTPATIENT_CLINIC_OR_DEPARTMENT_OTHER): Payer: Self-pay | Admitting: *Deleted

## 2014-09-03 ENCOUNTER — Emergency Department (HOSPITAL_BASED_OUTPATIENT_CLINIC_OR_DEPARTMENT_OTHER)
Admission: EM | Admit: 2014-09-03 | Discharge: 2014-09-03 | Disposition: A | Discharge status: Home / Self Care | Payer: PRIVATE HEALTH INSURANCE | Attending: Emergency Medicine | Admitting: Emergency Medicine

## 2014-09-03 DIAGNOSIS — Z720 Tobacco use: Secondary | ICD-10-CM | POA: Insufficient documentation

## 2014-09-03 DIAGNOSIS — Z792 Long term (current) use of antibiotics: Secondary | ICD-10-CM | POA: Insufficient documentation

## 2014-09-03 DIAGNOSIS — Z7952 Long term (current) use of systemic steroids: Secondary | ICD-10-CM | POA: Insufficient documentation

## 2014-09-03 DIAGNOSIS — H6122 Impacted cerumen, left ear: Secondary | ICD-10-CM | POA: Insufficient documentation

## 2014-09-03 MED ORDER — NEOMYCIN-COLIST-HC-THONZONIUM 3.3-3-10-0.5 MG/ML OT SUSP
4.0000 [drp] | OTIC | Status: DC
  Administered 2014-09-03: 4 [drp] via OTIC
  Filled 2014-09-03: qty 5

## 2014-09-03 NOTE — ED Provider Notes (Signed)
CSN: [id number]     Arrival date & time 09/03/14  7829 History   First MD Initiated Contact with Patient 09/03/14 0930     Chief Complaint  Patient presents with  . Ear Fullness     (Consider location/radiation/quality/duration/timing/severity/associated sxs/prior Treatment) HPI Comments: Patient presents to the ER for evaluation of fullness and decreased hearing in his left ear for 3 days. He tried to use over-the-counter earwax removal system, it did not help. He feels like there is some irritation and pain there now. He has not noticed any drainage.  Patient is a 34 y.o. male presenting with plugged ear sensation.  Ear Fullness    Past Medical History  Diagnosis Date  . Substance abuse    Past Surgical History  Procedure Laterality Date  . Facial cosmetic surgery     No family history on file. History  Substance Use Topics  . Smoking status: Current Every Day Smoker -- 0.50 packs/day    Types: Cigarettes  . Smokeless tobacco: Not on file  . Alcohol Use: Yes     Comment: occasional    Review of Systems  HENT: Positive for ear pain and hearing loss.   All other systems reviewed and are negative.     Allergies  Review of patient's allergies indicates no known allergies.  Home Medications   Prior to Admission medications   Medication Sig Start Date End Date Taking? Authorizing Provider  acetaminophen (TYLENOL) 500 MG tablet Take 1,000 mg by mouth once as needed. For pain    Historical Provider, MD  albuterol (PROVENTIL HFA;VENTOLIN HFA) 108 (90 BASE) MCG/ACT inhaler Inhale 2 puffs into the lungs every 2 (two) hours as needed for wheezing or shortness of breath (cough). 04/27/13   Riki Altes, MD  amoxicillin-clavulanate (AUGMENTIN) 500-125 MG per tablet Take 1 tablet (500 mg total) by mouth every 8 (eight) hours. 07/11/13   Pattricia Boss, MD  benzocaine (ORAJEL) 10 % mucosal gel Use as directed 1 application in the mouth or throat as needed for mouth pain.     Historical Provider, MD  clindamycin (CLEOCIN) 300 MG capsule Take 1 capsule (300 mg total) by mouth 4 (four) times daily. X 7 days 04/27/13   Riki Altes, MD  predniSONE (DELTASONE) 10 MG tablet Take 2 tablets (20 mg total) by mouth daily. 07/11/13   Pattricia Boss, MD  predniSONE (DELTASONE) 20 MG tablet 3 tabs po day one, then 2 po daily x 4 days 04/27/13   Riki Altes, MD   BP 123/76 mmHg  Pulse 74  Temp(Src) 97.8 F (36.6 C) (Oral)  Resp 20  SpO2 98% Physical Exam  Constitutional: He is oriented to person, place, and time. He appears well-developed and well-nourished. No distress.  HENT:  Head: Normocephalic and atraumatic.  Right Ear: Hearing normal.  Left Ear: There is swelling (canal). No drainage. Decreased hearing is noted.  Nose: Nose normal.  Mouth/Throat: Oropharynx is clear and moist and mucous membranes are normal.  Rolling and erythema of left external canal without exudate. Cerumen impaction present  Eyes: Conjunctivae and EOM are normal. Pupils are equal, round, and reactive to light.  Neck: Normal range of motion. Neck supple.  Cardiovascular: Regular rhythm, S1 normal and S2 normal.  Exam reveals no gallop and no friction rub.   No murmur heard. Pulmonary/Chest: Effort normal and breath sounds normal. No respiratory distress. He exhibits no tenderness.  Abdominal: Soft. Normal appearance and bowel sounds are normal. There is no hepatosplenomegaly. There is no tenderness.  There is no rebound, no guarding, no tenderness at McBurney's point and negative Murphy's sign. No hernia.  Musculoskeletal: Normal range of motion.  Neurological: He is alert and oriented to person, place, and time. He has normal strength. No cranial nerve deficit or sensory deficit. Coordination normal. GCS eye subscore is 4. GCS verbal subscore is 5. GCS motor subscore is 6.  Skin: Skin is warm, dry and intact. No rash noted. No cyanosis.  Psychiatric: He has a normal mood and affect. His speech is  normal and behavior is normal. Thought content normal.  Nursing note and vitals reviewed.   ED Course  Procedures (including critical care time) Labs Review Labs Reviewed - No data to display  Imaging Review No results found.   EKG Interpretation None      MDM   Final diagnoses:  None   cerumen impaction  Cerumen impaction removed by nursing staff. This was performed by irrigation with water and peroxide. Examination after the procedure revealed a normal tympanic membrane. Canal was already very erythematous at arrival, no additional trauma noted upon inspection. Will be empirically treated with Cortisporin otic drops.   Orpah Greek, MD 09/03/14 1054

## 2014-09-03 NOTE — ED Notes (Signed)
Patient states he has a fullness in his left ear for the last three days.  States he does not know if it is ear wax or something has crawled into his ear.

## 2014-09-03 NOTE — ED Notes (Signed)
Pt given instructions for home use

## 2014-09-03 NOTE — Discharge Instructions (Signed)
Cerumen Impaction °A cerumen impaction is when the wax in your ear forms a plug. This plug usually causes reduced hearing. Sometimes it also causes an earache or dizziness. Removing a cerumen impaction can be difficult and painful. The wax sticks to the ear canal. The canal is sensitive and bleeds easily. If you try to remove a heavy wax buildup with a cotton tipped swab, you may push it in further. °Irrigation with water, suction, and small ear curettes may be used to clear out the wax. If the impaction is fixed to the skin in the ear canal, ear drops may be needed for a few days to loosen the wax. People who build up a lot of wax frequently can use ear wax removal products available in your local drugstore. °SEEK MEDICAL CARE IF:  °You develop an earache, increased hearing loss, or marked dizziness. °Document Released: 05/25/2004 Document Revised: 07/10/2011 Document Reviewed: 07/15/2009 °ExitCare® Patient Information ©2015 [employer]. This information is not intended to replace advice given to you by your health care provider. Make sure you discuss any questions you have with your health care provider. ° °

## 2015-08-14 ENCOUNTER — Encounter (HOSPITAL_BASED_OUTPATIENT_CLINIC_OR_DEPARTMENT_OTHER): Payer: Self-pay | Admitting: Emergency Medicine

## 2015-08-14 ENCOUNTER — Emergency Department (HOSPITAL_BASED_OUTPATIENT_CLINIC_OR_DEPARTMENT_OTHER)
Admission: EM | Admit: 2015-08-14 | Discharge: 2015-08-15 | Disposition: A | Discharge status: Other Acute Inpatient Hospital | Payer: Medicaid Other | Attending: Emergency Medicine | Admitting: Emergency Medicine

## 2015-08-14 ENCOUNTER — Emergency Department (HOSPITAL_BASED_OUTPATIENT_CLINIC_OR_DEPARTMENT_OTHER): Payer: Medicaid Other

## 2015-08-14 DIAGNOSIS — C9590 Leukemia, unspecified not having achieved remission: Secondary | ICD-10-CM | POA: Insufficient documentation

## 2015-08-14 DIAGNOSIS — R1012 Left upper quadrant pain: Secondary | ICD-10-CM | POA: Diagnosis present

## 2015-08-14 DIAGNOSIS — R161 Splenomegaly, not elsewhere classified: Secondary | ICD-10-CM | POA: Diagnosis not present

## 2015-08-14 DIAGNOSIS — C9500 Acute leukemia of unspecified cell type not having achieved remission: Secondary | ICD-10-CM

## 2015-08-14 DIAGNOSIS — F17210 Nicotine dependence, cigarettes, uncomplicated: Secondary | ICD-10-CM | POA: Diagnosis not present

## 2015-08-14 DIAGNOSIS — R0602 Shortness of breath: Secondary | ICD-10-CM | POA: Diagnosis not present

## 2015-08-14 LAB — CBC WITH DIFFERENTIAL/PLATELET
BASOS ABS: 0 10*3/uL (ref 0.0–0.1)
BASOS PCT: 0 %
EOS ABS: 0 10*3/uL (ref 0.0–0.7)
EOS PCT: 0 %
HEMATOCRIT: 17 % — AB (ref 39.0–52.0)
HEMOGLOBIN: 5.5 g/dL — AB (ref 13.0–17.0)
Lymphocytes Relative: 41 %
Lymphs Abs: 39.6 10*3/uL — ABNORMAL HIGH (ref 0.7–4.0)
MCH: 31.6 pg (ref 26.0–34.0)
MCHC: 32.4 g/dL (ref 30.0–36.0)
MCV: 97.7 fL (ref 78.0–100.0)
MONOS PCT: 1 %
Monocytes Absolute: 1 10*3/uL (ref 0.1–1.0)
NEUTROS PCT: 8 %
Neutro Abs: 7.7 10*3/uL (ref 1.7–7.7)
Other: 50 %
Platelets: 22 10*3/uL — CL (ref 150–400)
RBC: 1.74 MIL/uL — AB (ref 4.22–5.81)
RDW: 18.3 % — ABNORMAL HIGH (ref 11.5–15.5)
WBC: 96.7 10*3/uL — AB (ref 4.0–10.5)
nRBC: 3 /100 WBC — ABNORMAL HIGH

## 2015-08-14 LAB — COMPREHENSIVE METABOLIC PANEL
ALBUMIN: 3.9 g/dL (ref 3.5–5.0)
ALK PHOS: 82 U/L (ref 38–126)
ALT: 36 U/L (ref 17–63)
AST: 63 U/L — AB (ref 15–41)
Anion gap: 8 (ref 5–15)
BILIRUBIN TOTAL: 0.4 mg/dL (ref 0.3–1.2)
BUN: 15 mg/dL (ref 6–20)
CO2: 24 mmol/L (ref 22–32)
CREATININE: 1.19 mg/dL (ref 0.61–1.24)
Calcium: 9.1 mg/dL (ref 8.9–10.3)
Chloride: 102 mmol/L (ref 101–111)
GFR calc Af Amer: >60 mL/min (ref >60)
Glucose, Bld: 145 mg/dL — ABNORMAL HIGH (ref 65–99)
Potassium: 3.8 mmol/L (ref 3.5–5.1)
Sodium: 134 mmol/L — ABNORMAL LOW (ref 135–145)
TOTAL PROTEIN: 6.5 g/dL (ref 6.5–8.1)

## 2015-08-14 LAB — TROPONIN I: Troponin I: 0.38 ng/mL — ABNORMAL HIGH (ref <0.031)

## 2015-08-14 LAB — I-STAT CG4 LACTIC ACID, ED: Lactic Acid, Venous: 1.7 mmol/L (ref 0.5–2.0)

## 2015-08-14 LAB — LIPASE, BLOOD: Lipase: 14 U/L (ref 11–51)

## 2015-08-14 MED ORDER — IOPAMIDOL (ISOVUE-300) INJECTION 61%
100.0000 mL | Freq: Once | INTRAVENOUS | Status: AC | PRN
  Administered 2015-08-14: 100 mL via INTRAVENOUS

## 2015-08-14 MED ORDER — HYDROMORPHONE HCL 1 MG/ML IJ SOLN
1.0000 mg | Freq: Once | INTRAMUSCULAR | Status: AC
  Administered 2015-08-14: 1 mg via INTRAVENOUS
  Filled 2015-08-14: qty 1

## 2015-08-14 MED ORDER — KETOROLAC TROMETHAMINE 30 MG/ML IJ SOLN: 30.0000 mg | Freq: Once | INTRAMUSCULAR | Status: DC

## 2015-08-14 MED ORDER — ONDANSETRON HCL 4 MG/2ML IJ SOLN
4.0000 mg | Freq: Once | INTRAMUSCULAR | Status: AC
  Administered 2015-08-14: 4 mg via INTRAVENOUS
  Filled 2015-08-14: qty 2

## 2015-08-14 MED ORDER — MORPHINE SULFATE (PF) 4 MG/ML IV SOLN
4.0000 mg | Freq: Once | INTRAVENOUS | Status: AC
Start: 2015-08-14 — End: 2015-08-14
  Administered 2015-08-14: 4 mg via INTRAVENOUS
  Filled 2015-08-14: qty 1

## 2015-08-14 MED ORDER — SODIUM CHLORIDE 0.9 % IV BOLUS (SEPSIS)
1000.0000 mL | Freq: Once | INTRAVENOUS | Status: AC
  Administered 2015-08-14: 1000 mL via INTRAVENOUS

## 2015-08-14 MED ORDER — HYDROMORPHONE HCL 1 MG/ML IJ SOLN
1.0000 mg | INTRAMUSCULAR | Status: DC | PRN
  Administered 2015-08-15: 1 mg via INTRAVENOUS
  Filled 2015-08-14 (×2): qty 1

## 2015-08-14 MED ORDER — SODIUM CHLORIDE 0.9 % IV SOLN
INTRAVENOUS | Status: DC
  Administered 2015-08-14: 23:00:00 via INTRAVENOUS

## 2015-08-14 NOTE — ED Notes (Signed)
Pt reports left upper quadrant pain since this morning with progressive worsening and states pain is intensified with deep breathing and coughing.    Pt appearance pale.

## 2015-08-14 NOTE — ED Notes (Addendum)
Pt reports dizziness also and onset of visible diaphoresis during triage assessment.

## 2015-08-14 NOTE — ED Provider Notes (Signed)
CSN: [id number]     Arrival date & time 08/14/15  2028 History  By signing my name below, I, Altamease Oiler, attest that this documentation has been prepared under the direction and in the presence of Harvel Quale, MD. Electronically Signed: Altamease Oiler, ED Scribe. 08/14/2015.11:18 PM   Chief Complaint  Patient presents with  . Abdominal Pain    The history is provided by the patient. No language interpreter was used.   Steven Brewer is a 35 y.o. male with history of substance abuse who presents to the Emergency Department complaining of constant, 10/10 in severity, left upper abdominal pain with sudden onset today.  Deep breathing exacerbates the pain. Ibuprofen provided insufficient relief in pain. Associated symptoms include pallor, sweating, generalized weakness, a constant pounding headache for 2 weeks, and SOB. Pt denies fever, difficulty urinating, hematuria, recent vomiting (though he notes retching when he abstains from illicit drugs), diarrhea, and constipation. Pt states that he uses "a lot" of Molly, Ecstasy, and Cocaine. He last used illicit drugs 1 week ago. The pt is concerned because his mother and brother have cancer.   Past Medical History  Diagnosis Date  . Substance abuse    Past Surgical History  Procedure Laterality Date  . Facial cosmetic surgery     No family history on file. Social History  Substance Use Topics  . Smoking status: Current Every Day Smoker -- 0.50 packs/day    Types: Cigarettes  . Smokeless tobacco: None  . Alcohol Use: Yes     Comment: occasional    Review of Systems  Constitutional: Positive for diaphoresis. Negative for fever.  Respiratory: Positive for shortness of breath.   Gastrointestinal: Positive for abdominal pain. Negative for nausea, vomiting and diarrhea.  Genitourinary: Negative for hematuria and difficulty urinating.  Skin: Positive for color change.  Neurological: Positive for weakness and headaches.  All other  systems reviewed and are negative.     Allergies  Review of patient's allergies indicates no known allergies.  Home Medications   Prior to Admission medications   Medication Sig Start Date End Date Taking? Authorizing Provider  acetaminophen (TYLENOL) 500 MG tablet Take 1,000 mg by mouth once as needed. For pain    Historical Provider, MD  albuterol (PROVENTIL HFA;VENTOLIN HFA) 108 (90 BASE) MCG/ACT inhaler Inhale 2 puffs into the lungs every 2 (two) hours as needed for wheezing or shortness of breath (cough). 04/27/13   Riki Altes, MD  amoxicillin-clavulanate (AUGMENTIN) 500-125 MG per tablet Take 1 tablet (500 mg total) by mouth every 8 (eight) hours. 07/11/13   Pattricia Boss, MD  benzocaine (ORAJEL) 10 % mucosal gel Use as directed 1 application in the mouth or throat as needed for mouth pain.    Historical Provider, MD  clindamycin (CLEOCIN) 300 MG capsule Take 1 capsule (300 mg total) by mouth 4 (four) times daily. X 7 days 04/27/13   Riki Altes, MD  predniSONE (DELTASONE) 10 MG tablet Take 2 tablets (20 mg total) by mouth daily. 07/11/13   Pattricia Boss, MD  predniSONE (DELTASONE) 20 MG tablet 3 tabs po day one, then 2 po daily x 4 days 04/27/13   Riki Altes, MD   BP 113/63 mmHg  Pulse 96  Temp(Src) 99.1 F (37.3 C) (Oral)  Resp 28  Ht 5\' 11"  (1.803 m)  Wt 190 lb (86.183 kg)  BMI 26.51 kg/m2  SpO2 96% Physical Exam  Constitutional: He is oriented to person, place, and time. He appears well-developed and well-nourished.  HENT:  Head: Normocephalic and atraumatic.  Eyes: EOM are normal.  Neck: Normal range of motion.  Cardiovascular: Normal rate, regular rhythm, normal heart sounds and intact distal pulses.   Pulmonary/Chest: Effort normal and breath sounds normal. No respiratory distress.  CTAB  Abdominal: Soft. He exhibits no distension. There is tenderness.  Left sided tenderness  Splenomegaly   Musculoskeletal: Normal range of motion.  Neurological: He is alert and  oriented to person, place, and time.  Skin: Skin is warm and dry. No rash noted. There is pallor.  Psychiatric: He has a normal mood and affect. Judgment normal.  Nursing note and vitals reviewed.   ED Course  Procedures (including critical care time) DIAGNOSTIC STUDIES: Oxygen Saturation is 96% on RA,  normal by my interpretation.    COORDINATION OF CARE: 9:22 PM Discussed treatment plan which includes lab work, CT A/P with contrast, Toradol, and IVF with pt at bedside and pt agreed to plan.  10:51 PM-Consult complete with Dr. Alvy Bimler (Oncology). Patient case explained and discussed. Call ended at 10:55 PM   Labs Review Labs Reviewed  CBC WITH DIFFERENTIAL/PLATELET - Abnormal; Notable for the following:    WBC 96.7 (*)    RBC 1.74 (*)    Hemoglobin 5.5 (*)    HCT 17.0 (*)    RDW 18.3 (*)    Platelets 22 (*)    nRBC 3 (*)    Lymphs Abs 39.6 (*)    All other components within normal limits  COMPREHENSIVE METABOLIC PANEL - Abnormal; Notable for the following:    Sodium 134 (*)    Glucose, Bld 145 (*)    AST 63 (*)    All other components within normal limits  TROPONIN I - Abnormal; Notable for the following:    Troponin I 0.38 (*)    All other components within normal limits  LIPASE, BLOOD  URINE RAPID DRUG SCREEN, HOSP PERFORMED  URINALYSIS, ROUTINE W REFLEX MICROSCOPIC (NOT AT [facility])  PATHOLOGIST SMEAR REVIEW  I-STAT CG4 LACTIC ACID, ED  I-STAT CG4 LACTIC ACID, ED    Imaging Review Ct Abdomen Pelvis W Contrast  08/14/2015  CLINICAL DATA:  Left upper quadrant pain, suddenly worsened today. EXAM: CT ABDOMEN AND PELVIS WITH CONTRAST TECHNIQUE: Multidetector CT imaging of the abdomen and pelvis was performed using the standard protocol following bolus administration of intravenous contrast. CONTRAST:  161mL ISOVUE-300 IOPAMIDOL (ISOVUE-300) INJECTION 61% COMPARISON:  None. FINDINGS: There is massive splenomegaly, measuring approximately 10 x 21 x 25 cm. There are a few small  peripheral wedge-shaped areas of low attenuation within the spleen which may represent infarctions. No conclusive splenic mass is evident. No splenic hemorrhage is evident. There are unremarkable appearances of the liver, gallbladder, bile ducts, pancreas, adrenals and kidneys except for mild mass effect on the left kidney from the enlarged spleen. Bowel is unremarkable. The abdominal aorta is normal in caliber. Portal vein is patent. There is no atherosclerotic calcification. There is no adenopathy in the abdomen or pelvis. No acute inflammatory changes are evident in the abdomen or pelvis. There is no ascites. There is no significant abnormality in the lower chest. There is no significant skeletal abnormality. IMPRESSION: Massive splenomegaly. This degree of splenomegaly is usually associated with significant hematologic disorders, including hematologic malignancies. No splenic hemorrhage is evident. There are a few small presumed splenic infarctions. Electronically Signed   By: Andreas Newport M.D.   On: 08/14/2015 22:36   Dg Chest Portable 1 View  08/14/2015  CLINICAL DATA:  Pleuritic left upper quadrant abdominal pain. Worsening throughout the day. EXAM: PORTABLE CHEST 1 VIEW COMPARISON:  04/18/2013 FINDINGS: A single AP portable view of the chest demonstrates no focal airspace consolidation or alveolar edema. The lungs are grossly clear. There is no large effusion or pneumothorax. Cardiac and mediastinal contours appear unremarkable. IMPRESSION: No active disease. Electronically Signed   By: Andreas Newport M.D.   On: 08/14/2015 21:12   I have personally reviewed and evaluated these images and lab results as part of my medical decision-making.   EKG Interpretation   Date/Time:  Saturday August 14 2015 20:57:38 EDT Ventricular Rate:  91 PR Interval:  148 QRS Duration: 88 QT Interval:  373 QTC Calculation: 459 R Axis:   78 Text Interpretation:  Sinus rhythm No previous ECGs available  Confirmed by  NGUYEN, EMILY ([id number]) on 08/14/2015 10:05:06 PM      MDM  Patient was seen and evaluated in stable condition. Patient appeared ill but was nontoxic and in no acute distress. Laboratory results revealed a severe leukocytosis, anemia, thrombocytopenia. CT showed a massively enlarged spleen with likely infarcts. Patient required O2 supplementation for mild hypoxia. His troponin was elevated at 0.38. Case was initially discussed with oncology from the [facility] system who recommended that the patient be transferred to an academic center. Case was then discussed on the phone with Dr. Norma Fredrickson from [facility] who accepted the patient to the leukemia team service. Patient and family were updated on results and plan of care. Patient was treated with IV fluids and pain medication. As we did not have access to crossmatch blood no blood was given at this time. Final diagnoses:  None    1. Acute leukemia 2. Leukocytosis 3. Splenomegaly  I personally performed the services described in this documentation, which was scribed in my presence. The recorded information has been reviewed and is accurate.   Harvel Quale, MD 08/15/15 0111

## 2015-08-15 DIAGNOSIS — C9590 Leukemia, unspecified not having achieved remission: Secondary | ICD-10-CM | POA: Diagnosis not present

## 2015-08-15 DIAGNOSIS — R161 Splenomegaly, not elsewhere classified: Secondary | ICD-10-CM | POA: Diagnosis not present

## 2015-08-15 DIAGNOSIS — F17210 Nicotine dependence, cigarettes, uncomplicated: Secondary | ICD-10-CM | POA: Diagnosis not present

## 2015-08-15 DIAGNOSIS — R1012 Left upper quadrant pain: Secondary | ICD-10-CM | POA: Diagnosis present

## 2015-08-15 DIAGNOSIS — R0602 Shortness of breath: Secondary | ICD-10-CM | POA: Diagnosis not present

## 2015-08-15 MED ORDER — HYDROMORPHONE HCL 1 MG/ML IJ SOLN
1.0000 mg | INTRAMUSCULAR | Status: DC | PRN
  Administered 2015-08-15: 1 mg via INTRAVENOUS

## 2015-08-16 LAB — PATHOLOGIST SMEAR REVIEW

## 2017-01-11 IMAGING — CT CT ABD-PELV W/ CM
2 of 5 series · 16 of 46 positions shown, 18 images · IV contrast (APPLIED)
Comparison: None.

CLINICAL DATA: Left upper quadrant pain, suddenly worsened today.

EXAM:
CT ABDOMEN AND PELVIS WITH CONTRAST
TECHNIQUE: Multidetector CT imaging of the abdomen and pelvis was performed
using the standard protocol following bolus administration of
intravenous contrast.
CONTRAST:  100mL QKYMK2-5BB IOPAMIDOL (QKYMK2-5BB) INJECTION 61%

[Series 2: axial st · axial · 0.81mm/px · z∈[-412,+68]mm · 13 of 108 slices shown, 15 images]
[im 6/108  soft-tissue]
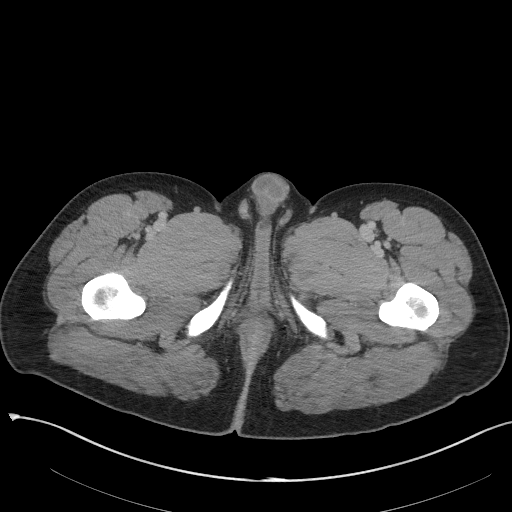
[im 6/108  bone]
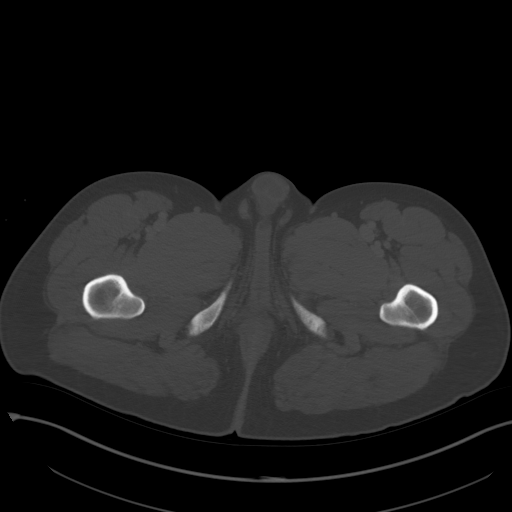
[im 17/108  soft-tissue]
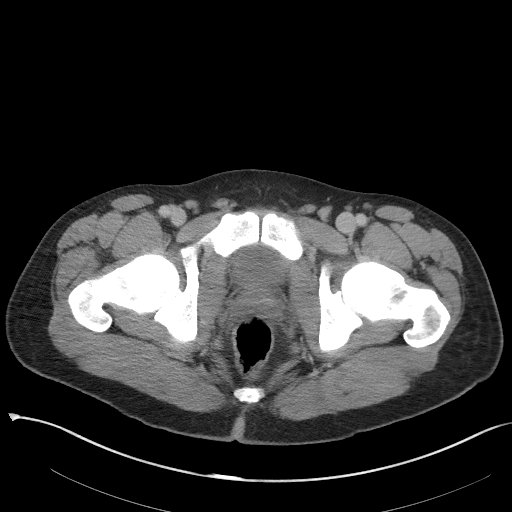
[im 23/108  soft-tissue]
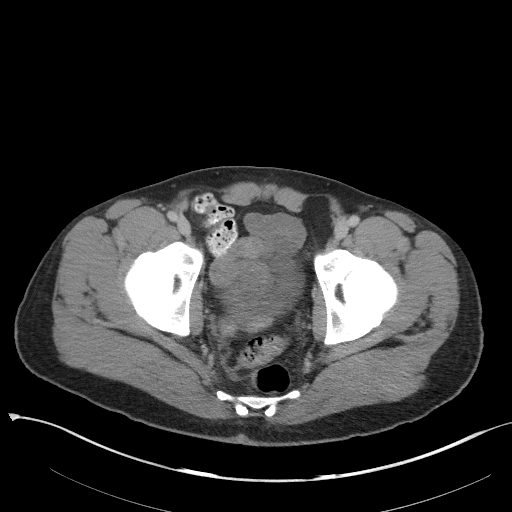
[im 29/108  soft-tissue]
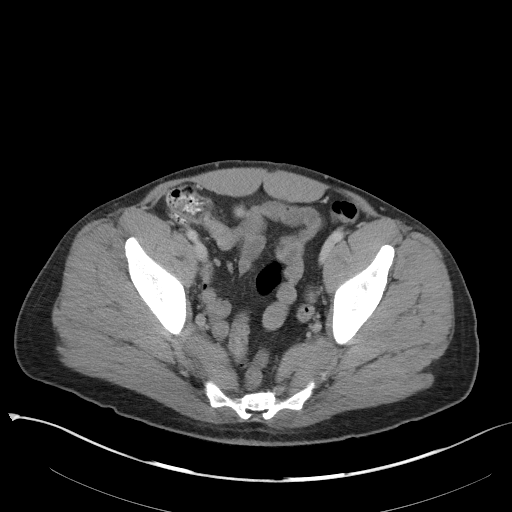
[im 40/108  soft-tissue]
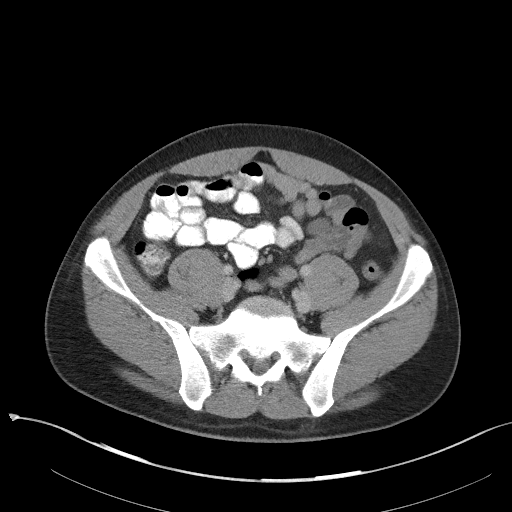
[im 46/108  soft-tissue]
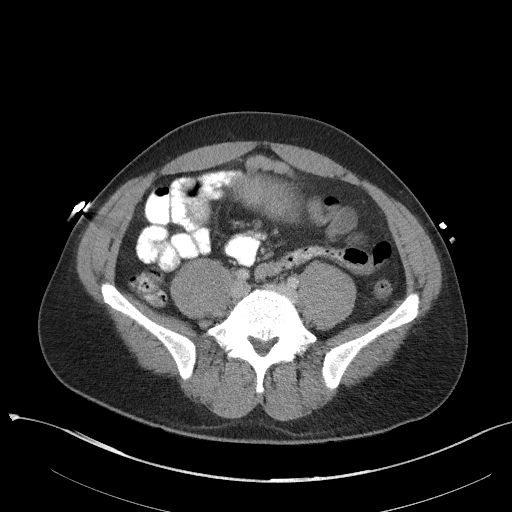
[im 57/108  soft-tissue]
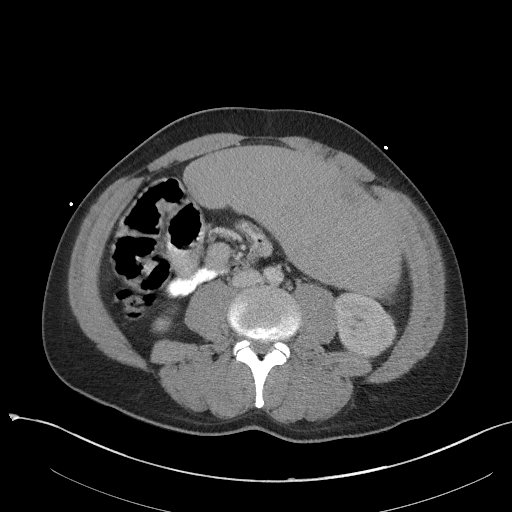
[im 62/108  soft-tissue]
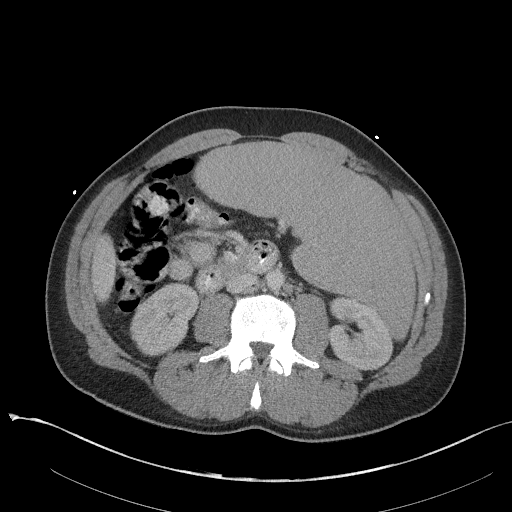
[im 68/108  soft-tissue]
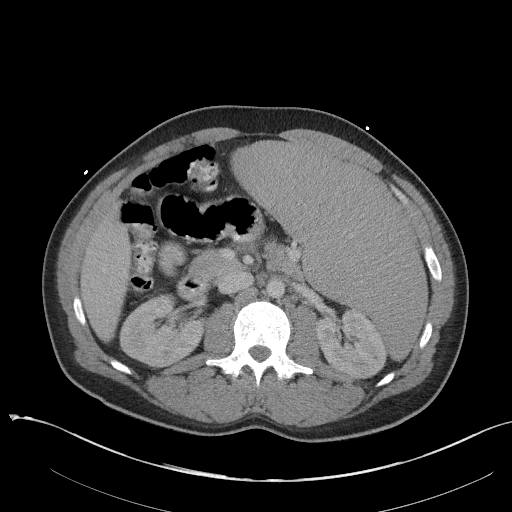
[im 68/108  bone]
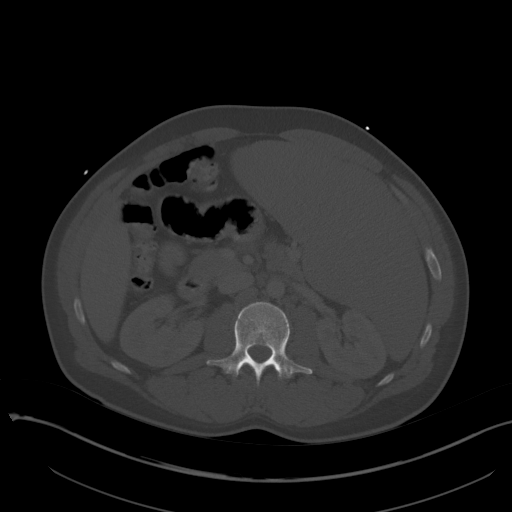
[im 79/108  soft-tissue]
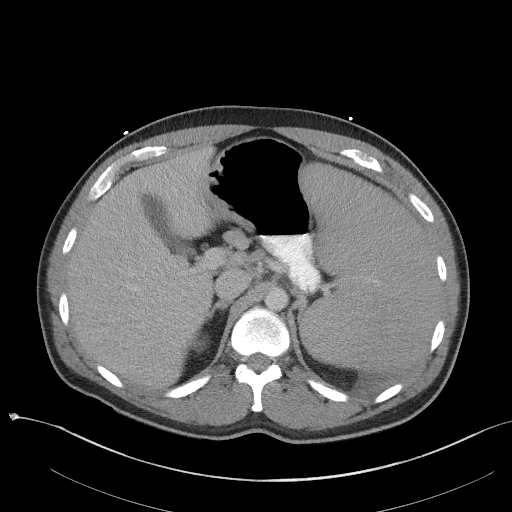
[im 85/108  soft-tissue]
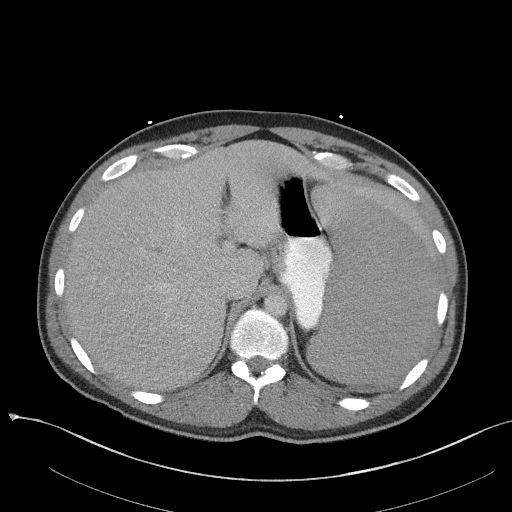
[im 91/108  soft-tissue]
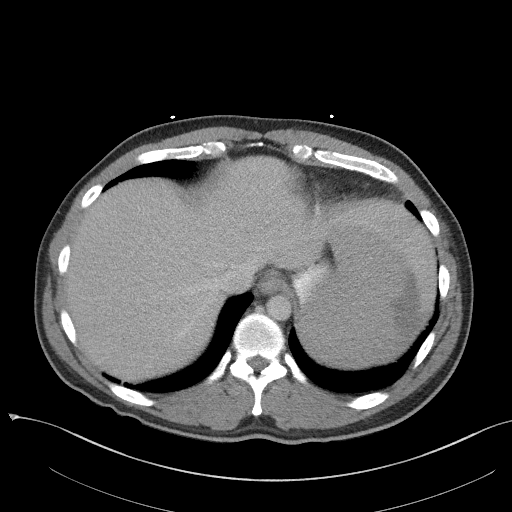
[im 102/108  soft-tissue]
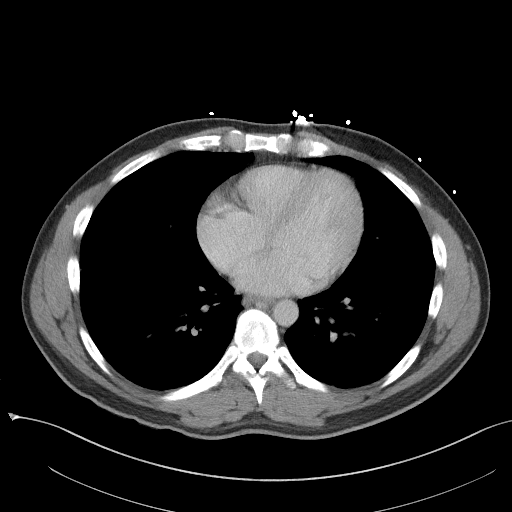

[Series 5: coronal st · coronal · 0.74mm/px · 3 of 101 slices shown]
[im 34/101  soft-tissue]
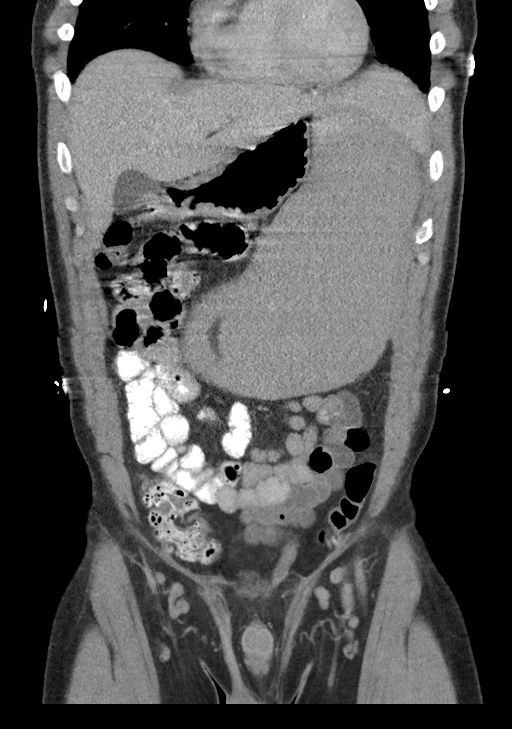
[im 45/101  soft-tissue]
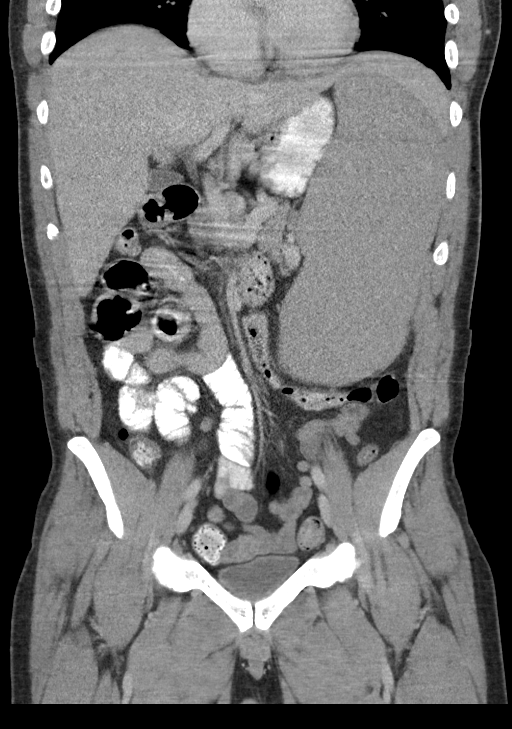
[im 56/101  soft-tissue]
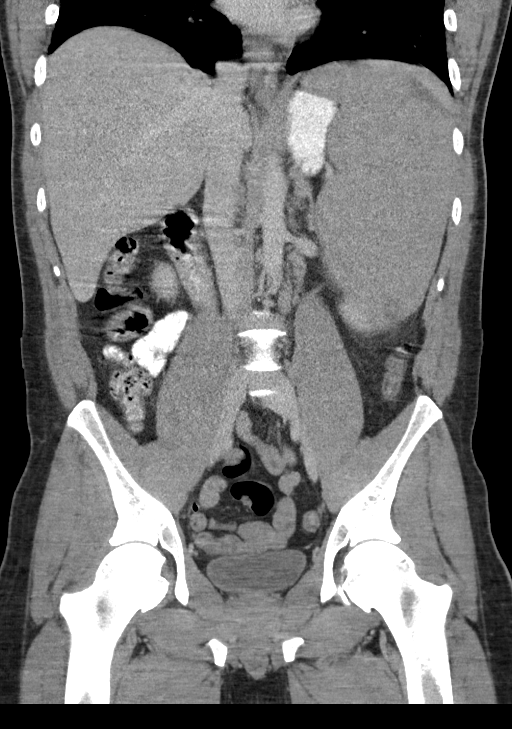

[16 of 46 positions shown; findings below may reference images not displayed]

FINDINGS: There is massive splenomegaly, measuring approximately 10 x 21 x 25
cm. There are a few small peripheral wedge-shaped areas of low
attenuation within the spleen which may represent infarctions. No
conclusive splenic mass is evident. No splenic hemorrhage is
evident.

There are unremarkable appearances of the liver, gallbladder, bile
ducts, pancreas, adrenals and kidneys except for mild mass effect on
the left kidney from the enlarged spleen.

Bowel is unremarkable.

The abdominal aorta is normal in caliber. Portal vein is patent.
There is no atherosclerotic calcification. There is no adenopathy in
the abdomen or pelvis.

No acute inflammatory changes are evident in the abdomen or pelvis.
There is no ascites.

There is no significant abnormality in the lower chest.

There is no significant skeletal abnormality.
IMPRESSION: Massive splenomegaly. This degree of splenomegaly is usually
associated with significant hematologic disorders, including
hematologic malignancies. No splenic hemorrhage is evident. There
are a few small presumed splenic infarctions.

## 2017-04-18 ENCOUNTER — Emergency Department (HOSPITAL_BASED_OUTPATIENT_CLINIC_OR_DEPARTMENT_OTHER)
Admission: EM | Admit: 2017-04-18 | Discharge: 2017-04-18 | Disposition: A | Discharge status: Home / Self Care | Payer: Medicaid Other | Attending: Emergency Medicine | Admitting: Emergency Medicine

## 2017-04-18 ENCOUNTER — Other Ambulatory Visit: Payer: Self-pay

## 2017-04-18 ENCOUNTER — Encounter (HOSPITAL_BASED_OUTPATIENT_CLINIC_OR_DEPARTMENT_OTHER): Payer: Self-pay

## 2017-04-18 DIAGNOSIS — J0111 Acute recurrent frontal sinusitis: Secondary | ICD-10-CM | POA: Insufficient documentation

## 2017-04-18 DIAGNOSIS — R51 Headache: Secondary | ICD-10-CM | POA: Diagnosis present

## 2017-04-18 DIAGNOSIS — C9100 Acute lymphoblastic leukemia not having achieved remission: Secondary | ICD-10-CM | POA: Diagnosis not present

## 2017-04-18 DIAGNOSIS — F17210 Nicotine dependence, cigarettes, uncomplicated: Secondary | ICD-10-CM | POA: Insufficient documentation

## 2017-04-18 HISTORY — DX: Leukemia, unspecified not having achieved remission: C95.90

## 2017-04-18 MED ORDER — LEVOFLOXACIN 750 MG PO TABS: 750.0000 mg | ORAL_TABLET | Freq: Every day | ORAL | 0 refills | Status: AC

## 2017-04-18 MED ORDER — LEVOFLOXACIN 750 MG PO TABS
750.0000 mg | ORAL_TABLET | Freq: Once | ORAL | Status: AC
  Administered 2017-04-18: 750 mg via ORAL
  Filled 2017-04-18: qty 1

## 2017-04-18 MED ORDER — PSEUDOEPHEDRINE HCL 60 MG PO TABS: 60.0000 mg | ORAL_TABLET | ORAL | 0 refills | Status: AC | PRN

## 2017-04-18 NOTE — ED Notes (Signed)
ED Provider at bedside. 

## 2017-04-18 NOTE — Discharge Instructions (Signed)
Please read and follow all provided instructions.  Your diagnoses today include:  1. Acute recurrent frontal sinusitis    Tests performed today include:  Vital signs. See below for your results today.   Medications prescribed:   Levofloxacin - medication for sinus infection   Pseudoephedrine - decongestant medication to help with nasal congestion  Take any prescribed medications only as directed.  Home care instructions:  Follow any educational materials contained in this packet.  BE VERY CAREFUL not to take multiple medicines containing Tylenol (also called acetaminophen). Doing so can lead to an overdose which can damage your liver and cause liver failure and possibly death.   Follow-up instructions: Please follow-up with your primary care provider in the next 7 days for further evaluation of your symptoms.   Return instructions:   Please return to the Emergency Department if you experience worsening symptoms.   Please return if you have any other emergent concerns.  Additional Information:  Your vital signs today were: BP 140/67 (BP Location: Left Arm)    Pulse 98    Temp 98.2 F (36.8 C) (Oral)    Resp 18    Ht 5\' 11"  (1.803 m)    Wt 87.5 kg (193 lb)    SpO2 96%    BMI 26.92 kg/m  If your blood pressure (BP) was elevated above 135/85 this visit, please have this repeated by your doctor within one month. --------------

## 2017-04-18 NOTE — ED Triage Notes (Signed)
Pt c/o sinus infection x 3 week-was treated with augmentin-states he was feels no better and now having fullness to both ears-completed yesterday-pt is leukemia/chemo pt-last chemo 12/9-NAD-steady gait

## 2017-04-18 NOTE — ED Provider Notes (Signed)
[facility] EMERGENCY DEPARTMENT Provider Note   CSN: [id number] Arrival date & time: 04/18/17  2016     History   Chief Complaint Chief Complaint  Patient presents with  . URI    HPI Steven Brewer is a 36 y.o. male.  Patient with history of ALL currently undergoing chemo therapy, last treatment 04/08/17 --presents with approximately 3-week history of sinus infection symptoms including headache, frontal pressure, muffled hearing and ear pain.  No fevers.  Family had similar symptoms at onset, however they improved.  Patient was treated with a 10-day course of Augmentin which he has now completed.  Chest symptoms improved however and sinus infection symptoms persist.  No nausea, vomiting, abdominal pain.  No other over-the-counter treatments.  Onset of symptoms acute.  Course is persistent.  Nothing makes symptoms worse.      Past Medical History:  Diagnosis Date  . Leukemia ([place])   . Substance abuse ([place])     There are no active problems to display for this patient.   Past Surgical History:  Procedure Laterality Date  . FACIAL COSMETIC SURGERY    . FACIAL FRACTURE SURGERY         Home Medications    Prior to Admission medications   Medication Sig Start Date End Date Taking? Authorizing Provider  levofloxacin (LEVAQUIN) 750 MG tablet Take 1 tablet (750 mg total) by mouth daily. 04/18/17   Carlisle Cater, PA-C  pseudoephedrine (SUDAFED) 60 MG tablet Take 1 tablet (60 mg total) by mouth every 4 (four) hours as needed for congestion. 04/18/17   Carlisle Cater, PA-C    Family History No family history on file.  Social History Social History   Tobacco Use  . Smoking status: Current Every Day Smoker    Packs/day: 0.50    Types: Cigarettes  . Smokeless tobacco: Never Used  Substance Use Topics  . Alcohol use: No    Frequency: Never  . Drug use: No     Allergies   Other; Shellfish allergy; and Tramadol   Review of Systems Review of Systems    Constitutional: Negative for chills, fatigue and fever.  HENT: Positive for congestion, sinus pressure and sinus pain. Negative for ear pain, rhinorrhea and sore throat.   Eyes: Negative for redness.  Respiratory: Negative for cough and wheezing.   Gastrointestinal: Negative for abdominal pain, diarrhea, nausea and vomiting.  Genitourinary: Negative for dysuria.  Musculoskeletal: Negative for myalgias and neck stiffness.  Skin: Negative for rash.  Neurological: Positive for headaches.  Hematological: Negative for adenopathy.     Physical Exam Updated Vital Signs BP 140/67 (BP Location: Left Arm)   Pulse 98   Temp 98.2 F (36.8 C) (Oral)   Resp 18   Ht 5\' 11"  (1.803 m)   Wt 87.5 kg (193 lb)   SpO2 96%   BMI 26.92 kg/m   Physical Exam  Constitutional: He appears well-developed and well-nourished.  HENT:  Head: Normocephalic and atraumatic.  Right Ear: External ear and ear canal normal. Tympanic membrane is injected. Tympanic membrane is not retracted.  Left Ear: External ear and ear canal normal. Tympanic membrane is retracted. Tympanic membrane is not injected.  Nose: No mucosal edema or rhinorrhea. Right sinus exhibits frontal sinus tenderness. Right sinus exhibits no maxillary sinus tenderness. Left sinus exhibits frontal sinus tenderness. Left sinus exhibits no maxillary sinus tenderness.  Mouth/Throat: Uvula is midline, oropharynx is clear and moist and mucous membranes are normal. Mucous membranes are not dry. No trismus in  the jaw. No uvula swelling. No oropharyngeal exudate, posterior oropharyngeal edema, posterior oropharyngeal erythema or tonsillar abscesses.  Eyes: Conjunctivae are normal. Right eye exhibits no discharge. Left eye exhibits no discharge.  Neck: Normal range of motion. Neck supple.  Cardiovascular: Normal rate, regular rhythm and normal heart sounds.  Pulmonary/Chest: Effort normal and breath sounds normal. No respiratory distress. He has no wheezes. He  has no rales.  Abdominal: Soft. There is no tenderness.  Neurological: He is alert.  Skin: Skin is warm and dry.  Psychiatric: He has a normal mood and affect.  Nursing note and vitals reviewed.    ED Treatments / Results   Procedures Procedures (including critical care time)  Medications Ordered in ED Medications  levofloxacin (LEVAQUIN) tablet 750 mg (not administered)     Initial Impression / Assessment and Plan / ED Course  I have reviewed the triage vital signs and the nursing notes.  Pertinent labs & imaging results that were available during my care of the patient were reviewed by me and considered in my medical decision making (see chart for details).     Patient seen and examined.  He is well-appearing 10 days after last chemotherapy.  No fevers.  Nontoxic appearance.  No other systemic symptoms of illness.  Vital signs reviewed and are as follows: BP 140/67 (BP Location: Left Arm)   Pulse 98   Temp 98.2 F (36.8 C) (Oral)   Resp 18   Ht 5\' 11"  (1.803 m)   Wt 87.5 kg (193 lb)   SpO2 96%   BMI 26.92 kg/m   He has close follow-up with his oncologist.  We will switch antibiotic to Levaquin 750 mg daily for 10 days.  Also advised use of Sudafed.  Counseled on return with fever or worsening symptoms, new symptoms.  Patient given first dose prior to discharge.  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  Persistent sinus infection symptoms for the past several weeks.  He did not respond to a prolonged course of Augmentin.  Will switch to Levaquin.  Encouraged close follow-up and return with worsening.   ED Discharge Orders        Ordered    levofloxacin (LEVAQUIN) 750 MG tablet  Daily     04/18/17 2145    pseudoephedrine (SUDAFED) 60 MG tablet  Every 4 hours PRN     04/18/17 2145       Carlisle Cater,  PA-C 04/18/17 2151    Little, Wenda Overland, MD 04/19/17 [phone]

## 2017-12-30 DEATH — deceased
